# Patient Record
Sex: Female | Born: 2000 | Race: White | Hispanic: No | Marital: Single | Smoking: Never smoker
Health system: Southern US, Community
[De-identification: ages and names within clinical notes are randomized; demographics above are authoritative.]

## PROBLEM LIST (undated history)

## (undated) DIAGNOSIS — R197 Diarrhea, unspecified: Secondary | ICD-10-CM

## (undated) HISTORY — DX: Diarrhea, unspecified: R19.7

---
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
## 2022-01-16 DIAGNOSIS — Z6820 Body mass index (BMI) 20.0-20.9, adult: Secondary | ICD-10-CM | POA: Diagnosis not present

## 2022-01-16 DIAGNOSIS — J0100 Acute maxillary sinusitis, unspecified: Secondary | ICD-10-CM | POA: Diagnosis not present

## 2022-03-02 DIAGNOSIS — J309 Allergic rhinitis, unspecified: Secondary | ICD-10-CM | POA: Diagnosis not present

## 2022-03-02 DIAGNOSIS — J0100 Acute maxillary sinusitis, unspecified: Secondary | ICD-10-CM | POA: Diagnosis not present

## 2022-03-02 DIAGNOSIS — Z6820 Body mass index (BMI) 20.0-20.9, adult: Secondary | ICD-10-CM | POA: Diagnosis not present

## 2022-03-06 DIAGNOSIS — J029 Acute pharyngitis, unspecified: Secondary | ICD-10-CM | POA: Diagnosis not present

## 2022-03-06 DIAGNOSIS — Z0000 Encounter for general adult medical examination without abnormal findings: Secondary | ICD-10-CM | POA: Diagnosis not present

## 2022-03-06 DIAGNOSIS — R059 Cough, unspecified: Secondary | ICD-10-CM | POA: Diagnosis not present

## 2022-05-25 DIAGNOSIS — H6593 Unspecified nonsuppurative otitis media, bilateral: Secondary | ICD-10-CM | POA: Diagnosis not present

## 2022-05-25 DIAGNOSIS — H73892 Other specified disorders of tympanic membrane, left ear: Secondary | ICD-10-CM | POA: Diagnosis not present

## 2022-06-17 DIAGNOSIS — R1031 Right lower quadrant pain: Secondary | ICD-10-CM | POA: Diagnosis not present

## 2022-06-17 DIAGNOSIS — R1013 Epigastric pain: Secondary | ICD-10-CM | POA: Diagnosis not present

## 2022-06-17 DIAGNOSIS — K529 Noninfective gastroenteritis and colitis, unspecified: Secondary | ICD-10-CM | POA: Diagnosis not present

## 2023-04-08 DIAGNOSIS — Z6822 Body mass index (BMI) 22.0-22.9, adult: Secondary | ICD-10-CM | POA: Diagnosis not present

## 2023-04-08 DIAGNOSIS — N3000 Acute cystitis without hematuria: Secondary | ICD-10-CM | POA: Diagnosis not present

## 2023-06-22 DIAGNOSIS — L259 Unspecified contact dermatitis, unspecified cause: Secondary | ICD-10-CM | POA: Diagnosis not present

## 2023-06-22 DIAGNOSIS — J039 Acute tonsillitis, unspecified: Secondary | ICD-10-CM | POA: Diagnosis not present

## 2023-06-24 DIAGNOSIS — J358 Other chronic diseases of tonsils and adenoids: Secondary | ICD-10-CM | POA: Diagnosis not present

## 2023-06-24 DIAGNOSIS — J0391 Acute recurrent tonsillitis, unspecified: Secondary | ICD-10-CM | POA: Diagnosis not present

## 2023-07-19 DIAGNOSIS — J3501 Chronic tonsillitis: Secondary | ICD-10-CM | POA: Diagnosis not present

## 2023-07-19 DIAGNOSIS — J351 Hypertrophy of tonsils: Secondary | ICD-10-CM | POA: Diagnosis not present

## 2023-08-16 DIAGNOSIS — J358 Other chronic diseases of tonsils and adenoids: Secondary | ICD-10-CM | POA: Diagnosis not present

## 2023-08-16 DIAGNOSIS — J0391 Acute recurrent tonsillitis, unspecified: Secondary | ICD-10-CM | POA: Diagnosis not present

## 2023-09-22 DIAGNOSIS — Z130 Encounter for screening for diseases of the blood and blood-forming organs and certain disorders involving the immune mechanism: Secondary | ICD-10-CM | POA: Diagnosis not present

## 2023-09-22 DIAGNOSIS — Z01419 Encounter for gynecological examination (general) (routine) without abnormal findings: Secondary | ICD-10-CM | POA: Diagnosis not present

## 2023-10-12 DIAGNOSIS — R051 Acute cough: Secondary | ICD-10-CM | POA: Diagnosis not present

## 2023-10-12 DIAGNOSIS — J019 Acute sinusitis, unspecified: Secondary | ICD-10-CM | POA: Diagnosis not present

## 2023-10-27 DIAGNOSIS — J019 Acute sinusitis, unspecified: Secondary | ICD-10-CM | POA: Diagnosis not present

## 2023-12-15 DIAGNOSIS — N921 Excessive and frequent menstruation with irregular cycle: Secondary | ICD-10-CM | POA: Diagnosis not present
